# Patient Record
Sex: Female | Born: 1965 | Marital: Married | Smoking: Never smoker
Health system: Southern US, Community
[De-identification: ages and names within clinical notes are randomized; demographics above are authoritative.]

---

## 2000-06-23 HISTORY — PX: AUGMENTATION MAMMAPLASTY: SUR837

## 2013-02-01 ENCOUNTER — Ambulatory Visit (INDEPENDENT_AMBULATORY_CARE_PROVIDER_SITE_OTHER): Payer: BC Managed Care – PPO | Admitting: Family Medicine

## 2013-02-01 VITALS — BP 118/68 | HR 66 | Temp 97.9°F | Resp 18 | Ht 59.0 in | Wt 120.4 lb

## 2013-02-01 DIAGNOSIS — R079 Chest pain, unspecified: Secondary | ICD-10-CM

## 2013-02-01 DIAGNOSIS — H47231 Glaucomatous optic atrophy, right eye: Secondary | ICD-10-CM

## 2013-02-01 DIAGNOSIS — Z0000 Encounter for general adult medical examination without abnormal findings: Secondary | ICD-10-CM

## 2013-02-01 LAB — POCT CBC
Granulocyte percent: 50.6 %G (ref 37–80)
HCT, POC: 44 % (ref 37.7–47.9)
Hemoglobin: 14.1 g/dL (ref 12.2–16.2)
Lymph, poc: 2.1 (ref 0.6–3.4)
MCH, POC: 29.7 pg (ref 27–31.2)
MCHC: 32 g/dL (ref 31.8–35.4)
MCV: 92.9 fL (ref 80–97)
MID (cbc): 0.4 (ref 0–0.9)
MPV: 9.3 fL (ref 0–99.8)
POC Granulocyte: 2.6 (ref 2–6.9)
POC LYMPH PERCENT: 40.7 %L (ref 10–50)
POC MID %: 8.7 %M (ref 0–12)
Platelet Count, POC: 276 10*3/uL (ref 142–424)
RBC: 4.74 M/uL (ref 4.04–5.48)
RDW, POC: 13.7 %
WBC: 5.1 10*3/uL (ref 4.6–10.2)

## 2013-02-01 LAB — GLUCOSE, POCT (MANUAL RESULT ENTRY): POC Glucose: 70 mg/dl (ref 70–99)

## 2013-02-01 NOTE — Progress Notes (Addendum)
Patient IDSophiarose Rollins MRN: [id number], DOB: 1966/06/16, 47 y.o. Date of Encounter: 02/01/2013, 7:40 PM  Primary Physician: No PCP Per Patient  Chief Complaint: Physical (CPE)  HPI: 47 y.o. y/o female with history of noted below here for CPE.  Doing well. No issues/complaints.  LMP: July 20 Pap: 4 years ago Review of Systems: Consitutional: No fever, chills, fatigue, night sweats, lymphadenopathy, or weight changes. Eyes: No visual changes, eye redness, or discharge. ENT/Mouth: Ears: No otalgia, tinnitus, hearing loss, discharge. Nose: No congestion, rhinorrhea, sinus pain, or epistaxis. Throat: No sore throat, post nasal drip, or teeth pain. Cardiovascular: No CP, palpitations, diaphoresis, DOE, edema, orthopnea, PND. Respiratory: No cough, hemoptysis, SOB, or wheezing. Gastrointestinal: No anorexia, dysphagia, reflux, pain, nausea, vomiting, hematemesis, diarrhea, constipation, BRBPR, or melena. Breast: No discharge, pain, swelling, or mass. Genitourinary: No dysuria, frequency, urgency, hematuria, incontinence, nocturia, amenorrhea, vaginal discharge, pruritis, burning, abnormal bleeding, or pain. Musculoskeletal: No decreased ROM, myalgias, stiffness, joint swelling, or weakness. Skin: No rash, erythema, lesion changes, pain, warmth, jaundice, or pruritis. Neurological: No headache, dizziness, syncope, seizures, tremors, memory loss, coordination problems, or paresthesias. Psychological: No anxiety, depression, hallucinations, SI/HI. Endocrine: No fatigue, polydipsia, polyphagia, polyuria, or known diabetes. All other systems were reviewed and are otherwise negative.  History reviewed. No pertinent past medical history.   History reviewed. No pertinent past surgical history.  Home Meds:  Prior to Admission medications   Not on File    Allergies: No Known Allergies  History   Social History  . Marital Status: Married    Spouse Name: N/A    Number of Children: N/A   . Years of Education: N/A   Occupational History  . Not on file.   Social History Main Topics  . Smoking status: Never Smoker   . Smokeless tobacco: Not on file  . Alcohol Use: Not on file  . Drug Use: Not on file  . Sexually Active: Not on file   Other Topics Concern  . Not on file   Social History Narrative  . No narrative on file    No family history on file.  Physical Exam: Blood pressure 118/68, pulse 66, temperature 97.9 F (36.6 C), temperature source Oral, resp. rate 18, height 4\' 11"  (1.499 m), weight 120 lb 6.4 oz (54.613 kg), last menstrual period 01/09/2013, SpO2 100.00%., Body mass index is 24.3 kg/(m^2). General: Well developed, well nourished, in no acute distress. HEENT: Normocephalic, atraumatic. Conjunctiva pink, sclera non-icteric. Pupils 2 mm constricting to 1 mm, round, regular, and equally reactive to light and accomodation. EOMI. Internal auditory canal clear. TMs with good cone of light and without pathology. Nasal mucosa pink. Nares are without discharge. No sinus tenderness. Oral mucosa pink. Dentition normal. Pharynx without exudate. Increased cupping right optic disc Neck: Supple. Trachea midline. No thyromegaly. Full ROM. No lymphadenopathy. Lungs: Clear to auscultation bilaterally without wheezes, rales, or rhonchi. Breathing is of normal effort and unlabored. Cardiovascular: RRR with S1 S2. No murmurs, rubs, or gallops appreciated. Distal pulses 2+ symmetrically. No carotid or abdominal bruits. Breast: Symmetrical. No masses. Nipples without discharge. Abdomen: Soft, non-tender, non-distended with normoactive bowel sounds. No hepatosplenomegaly or masses. No rebound/guarding. No CVA tenderness. Without hernias.  Genitourinary:  External genitalia without lesions. Vaginal mucosa pink. Cervix pink and without discharge. No cervical or adnexal tenderness. Pap smear taken Musculoskeletal: Full range of motion and 5/5 strength throughout. Without swelling,  atrophy, tenderness, crepitus, or warmth. Extremities without clubbing, cyanosis, or edema. Calves supple. Skin: Warm and moist  without erythema, ecchymosis, wounds, or rash. Neuro: A+Ox3. CN II-XII grossly intact. Moves all extremities spontaneously. Full sensation throughout. Normal gait. DTR 2+ throughout upper and lower extremities. Finger to nose intact. Psych:  Responds to questions appropriately with a normal affect.   Studies:   Results for orders placed in visit on 02/01/13  GLUCOSE, POCT (MANUAL RESULT ENTRY)      Result Value Range   POC Glucose 70  70 - 99 mg/dl  POCT CBC      Result Value Range   WBC 5.1  4.6 - 10.2 K/uL   Lymph, poc 2.1  0.6 - 3.4   POC LYMPH PERCENT 40.7  10 - 50 %L   MID (cbc) 0.4  0 - 0.9   POC MID % 8.7  0 - 12 %M   POC Granulocyte 2.6  2 - 6.9   Granulocyte percent 50.6  37 - 80 %G   RBC 4.74  4.04 - 5.48 M/uL   Hemoglobin 14.1  12.2 - 16.2 g/dL   HCT, POC 16.1  09.6 - 47.9 %   MCV 92.9  80 - 97 fL   MCH, POC 29.7  27 - 31.2 pg   MCHC 32.0  31.8 - 35.4 g/dL   RDW, POC 04.5     Platelet Count, POC 276  142 - 424 K/uL   MPV 9.3  0 - 99.8 fL     Assessment/Plan:  47 y.o. y/o female here for CPE -Chest pain - Plan: EKG 12-Lead  Routine general medical examination at a health care facility - Plan: POCT glucose (manual entry), POCT CBC, Comprehensive metabolic panel  Ophthalmology referral  Signed, Elvina Sidle, MD 02/01/2013 7:40 PM

## 2013-02-01 NOTE — Patient Instructions (Signed)

## 2013-02-01 NOTE — Addendum Note (Signed)
Addended by: Elvina Sidle on: 02/01/2013 08:42 PM   Modules accepted: Orders

## 2013-02-02 LAB — COMPREHENSIVE METABOLIC PANEL
ALT: 8 U/L (ref 0–35)
AST: 16 U/L (ref 0–37)
Albumin: 4.8 g/dL (ref 3.5–5.2)
Alkaline Phosphatase: 38 U/L — ABNORMAL LOW (ref 39–117)
BUN: 12 mg/dL (ref 6–23)
CO2: 27 mEq/L (ref 19–32)
Calcium: 9.8 mg/dL (ref 8.4–10.5)
Chloride: 100 mEq/L (ref 96–112)
Creat: 0.77 mg/dL (ref 0.50–1.10)
Glucose, Bld: 91 mg/dL (ref 70–99)
Potassium: 4.1 mEq/L (ref 3.5–5.3)
Sodium: 136 mEq/L (ref 135–145)
Total Bilirubin: 0.4 mg/dL (ref 0.3–1.2)
Total Protein: 8.1 g/dL (ref 6.0–8.3)

## 2013-02-03 LAB — PAP IG (IMAGE GUIDED)

## 2014-05-02 ENCOUNTER — Ambulatory Visit (INDEPENDENT_AMBULATORY_CARE_PROVIDER_SITE_OTHER): Payer: No Typology Code available for payment source | Admitting: Physician Assistant

## 2014-05-02 VITALS — BP 123/70 | HR 68 | Temp 98.1°F | Resp 16 | Ht 59.0 in | Wt 125.2 lb

## 2014-05-02 DIAGNOSIS — Z23 Encounter for immunization: Secondary | ICD-10-CM

## 2014-05-02 NOTE — Patient Instructions (Addendum)
Imodium - to help with diarrhea Peptobismal - to help with stomach upset.Tdap Vaccine (Tetanus, Diphtheria, Pertussis): What You Need to Know 1. Why get vaccinated? Tetanus, diphtheria and pertussis can be very serious diseases, even for adolescents and adults. Tdap vaccine can protect us from these diseases. TETANUS (Lockjaw) causes painful muscle tightening and stiffness, usually all over the body.  It can lead to tightening of muscles in the head and neck so you can't open your mouth, swallow, or sometimes even breathe. Tetanus kills about 1 out of 5 people who are infected. DIPHTHERIA can cause a thick coating to form in the back of the throat.  It can lead to breathing problems, paralysis, heart failure, and death. PERTUSSIS (Whooping Cough) causes severe coughing spells, which can cause difficulty breathing, vomiting and disturbed sleep.  It can also lead to weight loss, incontinence, and rib fractures. Up to 2 in 100 adolescents and 5 in 100 adults with pertussis are hospitalized or have complications, which could include pneumonia or death. These diseases are caused by bacteria. Diphtheria and pertussis are spread from person to person through coughing or sneezing. Tetanus enters the body through cuts, scratches, or wounds. Before vaccines, the [place] saw as many as 200,000 cases a year of diphtheria and pertussis, and hundreds of cases of tetanus. Since vaccination began, tetanus and diphtheria have dropped by about 99% and pertussis by about 80%. 2. Tdap vaccine Tdap vaccine can protect adolescents and adults from tetanus, diphtheria, and pertussis. One dose of Tdap is routinely given at age 46 or 6312. People who did not get Tdap at that age should get it as soon as possible. Tdap is especially important for health care professionals and anyone having close contact with a baby younger than 12 months. Pregnant women should get a dose of Tdap during every pregnancy, to protect the  newborn from pertussis. Infants are most at risk for severe, life-threatening complications from pertussis. A similar vaccine, called Td, protects from tetanus and diphtheria, but not pertussis. A Td booster should be given every 10 years. Tdap may be given as one of these boosters if you have not already gotten a dose. Tdap may also be given after a severe cut or burn to prevent tetanus infection. Your doctor can give you more information. Tdap may safely be given at the same time as other vaccines. 3. Some people should not get this vaccine  If you ever had a life-threatening allergic reaction after a dose of any tetanus, diphtheria, or pertussis containing vaccine, OR if you have a severe allergy to any part of this vaccine, you should not get Tdap. Tell your doctor if you have any severe allergies.  If you had a coma, or long or multiple seizures within 7 days after a childhood dose of DTP or DTaP, you should not get Tdap, unless a cause other than the vaccine was found. You can still get Td.  Talk to your doctor if you:  have epilepsy or another nervous system problem,  had severe pain or swelling after any vaccine containing diphtheria, tetanus or pertussis,  ever had Guillain-Barr Syndrome (GBS),  aren't feeling well on the day the shot is scheduled. 4. Risks of a vaccine reaction With any medicine, including vaccines, there is a chance of side effects. These are usually mild and go away on their own, but serious reactions are also possible. Brief fainting spells can follow a vaccination, leading to injuries from falling. Sitting or lying down for about  15 minutes can help prevent these. Tell your doctor if you feel dizzy or light-headed, or have vision changes or ringing in the ears. Mild problems following Tdap (Did not interfere with activities)  Pain where the shot was given (about 3 in 4 adolescents or 2 in 3 adults)  Redness or swelling where the shot was given (about 1  person in 5)  Mild fever of at least 100.69F (up to about 1 in 25 adolescents or 1 in 100 adults)  Headache (about 3 or 4 people in 10)  Tiredness (about 1 person in 3 or 4)  Nausea, vomiting, diarrhea, stomach ache (up to 1 in 4 adolescents or 1 in 10 adults)  Chills, body aches, sore joints, rash, swollen glands (uncommon) Moderate problems following Tdap (Interfered with activities, but did not require medical attention)  Pain where the shot was given (about 1 in 5 adolescents or 1 in 100 adults)  Redness or swelling where the shot was given (up to about 1 in 16 adolescents or 1 in 25 adults)  Fever over 102F (about 1 in 100 adolescents or 1 in 250 adults)  Headache (about 3 in 20 adolescents or 1 in 10 adults)  Nausea, vomiting, diarrhea, stomach ache (up to 1 or 3 people in 100)  Swelling of the entire arm where the shot was given (up to about 3 in 100). Severe problems following Tdap (Unable to perform usual activities; required medical attention)  Swelling, severe pain, bleeding and redness in the arm where the shot was given (rare). A severe allergic reaction could occur after any vaccine (estimated less than 1 in a million doses). 5. What if there is a serious reaction? What should I look for?  Look for anything that concerns you, such as signs of a severe allergic reaction, very high fever, or behavior changes. Signs of a severe allergic reaction can include hives, swelling of the face and throat, difficulty breathing, a fast heartbeat, dizziness, and weakness. These would start a few minutes to a few hours after the vaccination. What should I do?  If you think it is a severe allergic reaction or other emergency that can't wait, call 9-1-1 or get the person to the nearest hospital. Otherwise, call your doctor.  Afterward, the reaction should be reported to the "Vaccine Adverse Event Reporting System" (VAERS). Your doctor might file this report, or you can do it  yourself through the VAERS web site at www.vaers.LAgents.no, or by calling 1-270-565-8487. VAERS is only for reporting reactions. They do not give medical advice.  6. The National Vaccine Injury Compensation Program The [employer] Vaccine Injury Compensation Program (VICP) is a federal program that was created to compensate people who may have been injured by certain vaccines. Persons who believe they may have been injured by a vaccine can learn about the program and about filing a claim by calling 1-978 155 0322 or visiting the VICP website at SpiritualWord.at. 7. How can I learn more?  Ask your doctor.  Call your local or state health department.  Contact the Centers for Disease Control and Prevention (CDC):  Call 336-374-2343 or visit CDC's website at PicCapture.uy. CDC Tdap Vaccine VIS (10/30/11) Document Released: 12/09/2011 Document Revised: 10/24/2013 Document Reviewed: 09/21/2013 ExitCare Patient Information 2015 [place], [place]. This information is not intended to replace advice given to you by your health care provider. Make sure you discuss any questions you have with your health care provider. Influenza Vaccine (Flu Vaccine, Inactivated or Recombinant) 2014-2015: What You Need to Know  1. Why get vaccinated? Influenza ("flu") is a contagious disease that spreads around the [place] every winter, usually between October and May. Flu is caused by influenza viruses, and is spread mainly by coughing, sneezing, and close contact. Anyone can get flu, but the risk of getting flu is highest among children. Symptoms come on suddenly and may last several days. They can include:  fever/chills  sore throat  muscle aches  fatigue  cough  headache  runny or stuffy nose Flu can make some people much sicker than others. These people include young children, people 3465 and older, pregnant women, and people with certain health conditions-such as heart, lung or  kidney disease, nervous system disorders, or a weakened immune system. Flu vaccination is especially important for these people, and anyone in close contact with them. Flu can also lead to pneumonia, and make existing medical conditions worse. It can cause diarrhea and seizures in children. Each year thousands of people in the [place] die from flu, and many more are hospitalized. Flu vaccine is the best protection against flu and its complications. Flu vaccine also helps prevent spreading flu from person to person. 2. Inactivated and recombinant flu vaccines You are getting an injectable flu vaccine, which is either an "inactivated" or "recombinant" vaccine. These vaccines do not contain any live influenza virus. They are given by injection with a needle, and often called the "flu shot."  A different live, attenuated (weakened) influenza vaccine is sprayed into the nostrils. This vaccine is described in a separate Vaccine Information Statement. Flu vaccination is recommended every year. Some children 6 months through 46 years of age might need two doses during one year. Flu viruses are always changing. Each year's flu vaccine is made to protect against 3 or 4 viruses that are likely to cause disease that year. Flu vaccine cannot prevent all cases of flu, but it is the best defense against the disease.  It takes about 2 weeks for protection to develop after the vaccination, and protection lasts several months to a year. Some illnesses that are not caused by influenza virus are often mistaken for flu. Flu vaccine will not prevent these illnesses. It can only prevent influenza. Some inactivated flu vaccine contains a very small amount of a mercury-based preservative called thimerosal. Studies have shown that thimerosal in vaccines is not harmful, but flu vaccines that do not contain a preservative are available. 3. Some people should not get this vaccine Tell the person who gives you the  vaccine:  If you have any severe, life-threatening allergies. If you ever had a life-threatening allergic reaction after a dose of flu vaccine, or have a severe allergy to any part of this vaccine, including (for example) an allergy to gelatin, antibiotics, or eggs, you may be advised not to get vaccinated. Most, but not all, types of flu vaccine contain a small amount of egg protein.  If you ever had Guillain-Barr Syndrome (a severe paralyzing illness, also called GBS). Some people with a history of GBS should not get this vaccine. This should be discussed with your doctor.  If you are not feeling well. It is usually okay to get flu vaccine when you have a mild illness, but you might be advised to wait until you feel better. You should come back when you are better. 4. Risks of a vaccine reaction With a vaccine, like any medicine, there is a chance of side effects. These are usually mild and go away on their own. Problems  that could happen after any vaccine:  Brief fainting spells can happen after any medical procedure, including vaccination. Sitting or lying down for about 15 minutes can help prevent fainting, and injuries caused by a fall. Tell your doctor if you feel dizzy, or have vision changes or ringing in the ears.  Severe shoulder pain and reduced range of motion in the arm where a shot was given can happen, very rarely, after a vaccination.  Severe allergic reactions from a vaccine are very rare, estimated at less than 1 in a million doses. If one were to occur, it would usually be within a few minutes to a few hours after the vaccination. Mild problems following inactivated flu vaccine:  soreness, redness, or swelling where the shot was given  hoarseness  sore, red or itchy eyes  cough  fever  aches  headache  itching  fatigue If these problems occur, they usually begin soon after the shot and last 1 or 2 days. Moderate problems following inactivated flu  vaccine:  Young children who get inactivated flu vaccine and pneumococcal vaccine (PCV13) at the same time may be at increased risk for seizures caused by fever. Ask your doctor for more information. Tell your doctor if a child who is getting flu vaccine has ever had a seizure. Inactivated flu vaccine does not contain live flu virus, so you cannot get the flu from this vaccine. As with any medicine, there is a very remote chance of a vaccine causing a serious injury or death. The safety of vaccines is always being monitored. For more information, visit: http://floyd.org/www.cdc.gov/vaccinesafety/ 5. What if there is a serious reaction? What should I look for?  Look for anything that concerns you, such as signs of a severe allergic reaction, very high fever, or behavior changes. Signs of a severe allergic reaction can include hives, swelling of the face and throat, difficulty breathing, a fast heartbeat, dizziness, and weakness. These would start a few minutes to a few hours after the vaccination. What should I do?  If you think it is a severe allergic reaction or other emergency that can't wait, call 9-1-1 and get the person to the nearest hospital. Otherwise, call your doctor.  Afterward, the reaction should be reported to the Vaccine Adverse Event Reporting System (VAERS). Your doctor should file this report, or you can do it yourself through the VAERS web site at www.vaers.LAgents.nohhs.gov, or by calling 1-989-315-9212. VAERS does not give medical advice. 6. The National Vaccine Injury Compensation Program The [employer] Vaccine Injury Compensation Program (VICP) is a federal program that was created to compensate people who may have been injured by certain vaccines. Persons who believe they may have been injured by a vaccine can learn about the program and about filing a claim by calling 1-(803)473-3855 or visiting the VICP website at SpiritualWord.atwww.hrsa.gov/vaccinecompensation. There is a time limit to file a claim for  compensation. 7. How can I learn more?  Ask your health care provider.  Call your local or state health department.  Contact the Centers for Disease Control and Prevention (CDC):  Call 314-797-97731-782-142-0648 (1-800-CDC-INFO) or  Visit CDC's website at BiotechRoom.com.cywww.cdc.gov/flu CDC Vaccine Information Statement (Interim) Inactivated Influenza Vaccine (02/08/2013) Document Released: 04/03/2006 Document Revised: 10/24/2013 Document Reviewed: 05/27/2013 [facility] Patient Information 2015 [place], [place]. This information is not intended to replace advice given to you by your health care provider. Make sure you discuss any questions you have with your health care provider.

## 2014-05-02 NOTE — Progress Notes (Signed)
   Subjective:    Patient ID: Abigail Rollins, female    DOB: 1966/01/25, 48 y.o.   MRN: [id number]  HPI  Pt presents for a Tdap vaccination prior to her trip to [place] for a month.  She is otherwise healthy and also would like a flu vaccine.  Review of Systems     Objective:   Physical Exam  Constitutional: She is oriented to person, place, and time. She appears well-developed and well-nourished.  BP 123/70 mmHg  Pulse 68  Temp(Src) 98.1 F (36.7 C) (Oral)  Resp 16  Ht 4\' 11"  (1.499 m)  Wt 125 lb 3.2 oz (56.79 kg)  BMI 25.27 kg/m2  SpO2 97%  LMP 04/30/2014   HENT:  Head: Normocephalic and atraumatic.  Right Ear: External ear normal.  Left Ear: External ear normal.  Eyes: Conjunctivae are normal.  Pulmonary/Chest: Effort normal.  Neurological: She is alert and oriented to person, place, and time.  Skin: Skin is warm and dry.  Psychiatric: She has a normal mood and affect. Her behavior is normal. Judgment and thought content normal.  Vitals reviewed.       Assessment & Plan:  Flu vaccine need - Plan: Flu Vaccine QUAD 36+ mos IM  Need for diphtheria-tetanus-pertussis (Tdap) vaccine, adult/adolescent - Plan: Tdap vaccine greater than or equal to 7yo IM  Benny LennertSarah Weber PA-C  [facility] 05/02/2014 8:00 PM

## 2014-07-17 ENCOUNTER — Ambulatory Visit (INDEPENDENT_AMBULATORY_CARE_PROVIDER_SITE_OTHER): Payer: 59 | Admitting: Family Medicine

## 2014-07-17 VITALS — BP 126/78 | HR 90 | Temp 98.5°F | Resp 24 | Ht 59.0 in | Wt 130.5 lb

## 2014-07-17 DIAGNOSIS — R05 Cough: Secondary | ICD-10-CM

## 2014-07-17 DIAGNOSIS — G4483 Primary cough headache: Secondary | ICD-10-CM

## 2014-07-17 DIAGNOSIS — R059 Cough, unspecified: Secondary | ICD-10-CM

## 2014-07-17 DIAGNOSIS — J069 Acute upper respiratory infection, unspecified: Secondary | ICD-10-CM

## 2014-07-17 MED ORDER — HYDROCODONE-HOMATROPINE 5-1.5 MG/5ML PO SYRP: 5.0000 mL | ORAL_SOLUTION | ORAL | Status: DC | PRN

## 2014-07-17 MED ORDER — BENZONATATE 100 MG PO CAPS: 100.0000 mg | ORAL_CAPSULE | Freq: Three times a day (TID) | ORAL | Status: DC | PRN

## 2014-07-17 MED ORDER — AMOXICILLIN-POT CLAVULANATE 875-125 MG PO TABS: 1.0000 | ORAL_TABLET | Freq: Two times a day (BID) | ORAL | Status: DC

## 2014-07-17 NOTE — Patient Instructions (Addendum)
Drink plenty of fluids (including water and juices) and get enough rest  Take hydrocodone cough syrup 1 teaspoon every 4-6 hours as needed for cough. This will also help your headache. However this will make you a little sleepy, so probably best to not take it when you're going to work  Take the benzonatate 1 or 2 pills 3 times daily as needed for cough. This does not make you sleepy so you can take this during the daytime when you're trying to work or be active.  Take the amoxicillin/clavulanate antibiotic pills one twice daily for infection  Take Tylenol or ibuprofen if needed for headache or other pain or fever  Return if not improving

## 2014-07-17 NOTE — Progress Notes (Signed)
Subjective: Patient has been sick for about 3 weeks with a respiratory tract infection. Over a week ago she developed bad coughing. She has had some facial discomfort. She does not smoke. She has not been running a fever. She is blowing and coughing up a lot of purulent stuff. She is generally pretty healthy.  Objective: TMs normal. Throat clear. Mild maxillary tenderness. Neck supple without nodes. Chest clear. Heart regular without murmurs.   Assessment: Prolonged upper respiratory tract infection with worsening cough  Plan: Augmentin, cough syrup, cough medication Return as needed

## 2014-07-19 ENCOUNTER — Telehealth: Payer: Self-pay

## 2014-07-19 NOTE — Telephone Encounter (Signed)
Pt's daughter called to request a refill for pt's cough medicine. States the mother would like a refill for the cough syrup. CB # [phone]

## 2014-07-20 NOTE — Telephone Encounter (Signed)
Patient called back to check on medication refill

## 2015-09-14 ENCOUNTER — Ambulatory Visit (INDEPENDENT_AMBULATORY_CARE_PROVIDER_SITE_OTHER): Payer: BLUE CROSS/BLUE SHIELD | Admitting: Internal Medicine

## 2015-09-14 VITALS — BP 120/70 | HR 71 | Temp 97.7°F | Resp 16 | Ht 60.0 in | Wt 126.6 lb

## 2015-09-14 DIAGNOSIS — Z0000 Encounter for general adult medical examination without abnormal findings: Secondary | ICD-10-CM | POA: Diagnosis not present

## 2015-09-14 NOTE — Progress Notes (Signed)
Subjective:  By signing my name below, I, Moises Blood, attest that this documentation has been prepared under the direction and in the presence of Tami Lin, MD. Electronically Signed: Moises Blood, Mar-Mac. 09/14/2015 , 7:35 PM .  Patient was seen in Room 12 .   Patient ID: Abigail Rollins, female    DOB: [birth year], 50 y.o.   MRN: [id number] Chief Complaint  Patient presents with  . Annual Exam   HPI Abigail Rollins is a 50 y.o. female who presents to [facility] for annual physical.   Pmhx She denies being on any chronic medications. She denies any chronic illnesses. She denies history of asthma.  Has a fully active life without problems  Cancer Screening Defer to gyn  Immunizations Her last tetanus was in 2015.   OBGYN She has a regular GYN. PAP 2014 wnl. She has 2 children. She has regular periods. She had tubal ligation in year 2000.   Personal Her family moved to [place] in 2002. [place]  There are no active problems to display for this patient.  No current outpatient prescriptions on file.  Review of Systems  Constitutional: Negative for fever, chills and fatigue.  HENT: Negative for dental problem, hearing loss, trouble swallowing and voice change.   Eyes: Negative for photophobia and visual disturbance.  Respiratory: Negative for chest tightness and shortness of breath.   Cardiovascular: Negative for chest pain, palpitations and leg swelling.  Gastrointestinal: Negative for nausea, vomiting, abdominal pain, diarrhea and blood in stool.  Endocrine: Negative for polydipsia, polyphagia and polyuria.  Genitourinary: Negative for difficulty urinating and menstrual problem.  Musculoskeletal: Negative for myalgias, arthralgias and gait problem.  Neurological: Negative for dizziness and headaches.  Hematological: Negative for adenopathy. Does not bruise/bleed easily.  Psychiatric/Behavioral: Negative for behavioral problems, sleep disturbance and dysphoric  mood. Hallucinations: .diag.      Objective:   Physical Exam  Constitutional: She is oriented to person, place, and time. She appears well-developed and well-nourished. No distress.  HENT:  Head: Normocephalic.  Right Ear: External ear normal.  Left Ear: External ear normal.  Nose: Nose normal.  Mouth/Throat: Oropharynx is clear and moist.  Eyes: Conjunctivae and EOM are normal. Pupils are equal, round, and reactive to light.  Neck: Normal range of motion. Neck supple. No thyromegaly present.  Cardiovascular: Normal rate, regular rhythm, normal heart sounds and intact distal pulses.   No murmur heard. Pulmonary/Chest: Effort normal and breath sounds normal. She has no wheezes.  Abdominal: Soft. Bowel sounds are normal. She exhibits no distension and no mass. There is no tenderness. There is no rebound.  Musculoskeletal: Normal range of motion. She exhibits no edema or tenderness.  Lymphadenopathy:    She has no cervical adenopathy.  Neurological: She is alert and oriented to person, place, and time. She has normal reflexes. No cranial nerve deficit.  Skin: Skin is warm and dry. No rash noted.  Psychiatric: She has a normal mood and affect. Her behavior is normal. Judgment and thought content normal.  Nursing note and vitals reviewed.   BP 120/70 mmHg  Pulse 71  Temp(Src) 97.7 F (36.5 C) (Oral)  Resp 16  Ht 5' (1.524 m)  Wt 126 lb 9.6 oz (57.425 kg)  BMI 24.72 kg/m2  SpO2 98%    Assessment & Plan:  I have completed the patient encounter in its entirety as documented by the scribe, with editing by me where necessary. Reilley Latorre P. Laney Pastor, M.D.  PE (physical exam), annual - Plan: CBC with  Differential/Platelet, Comprehensive metabolic panel, Lipid panel, TSH  Results for orders placed or performed in visit on 09/14/15  CBC with Differential/Platelet  Result Value Ref Range   WBC 5.3 4.0 - 10.5 K/uL   RBC 4.53 3.87 - 5.11 MIL/uL   Hemoglobin 13.4 12.0 - 15.0 g/dL   HCT  39.9 36.0 - 46.0 %   MCV 88.1 78.0 - 100.0 fL   MCH 29.6 26.0 - 34.0 pg   MCHC 33.6 30.0 - 36.0 g/dL   RDW 13.9 11.5 - 15.5 %   Platelets 292 150 - 400 K/uL   MPV 10.8 8.6 - 12.4 fL   Neutrophils Relative % 57 43 - 77 %   Neutro Abs 3.0 1.7 - 7.7 K/uL   Lymphocytes Relative 33 12 - 46 %   Lymphs Abs 1.7 0.7 - 4.0 K/uL   Monocytes Relative 7 3 - 12 %   Monocytes Absolute 0.4 0.1 - 1.0 K/uL   Eosinophils Relative 3 0 - 5 %   Eosinophils Absolute 0.2 0.0 - 0.7 K/uL   Basophils Relative 0 0 - 1 %   Basophils Absolute 0.0 0.0 - 0.1 K/uL   Smear Review Criteria for review not met   Comprehensive metabolic panel  Result Value Ref Range   Sodium 140 135 - 146 mmol/L   Potassium 3.7 3.5 - 5.3 mmol/L   Chloride 103 98 - 110 mmol/L   CO2 24 20 - 31 mmol/L   Glucose, Bld 114 (H) 65 - 99 mg/dL   BUN 18 7 - 25 mg/dL   Creat 0.66 0.50 - 1.10 mg/dL   Total Bilirubin 0.2 0.2 - 1.2 mg/dL   Alkaline Phosphatase 43 33 - 115 U/L   AST 15 10 - 35 U/L   ALT 10 6 - 29 U/L   Total Protein 7.0 6.1 - 8.1 g/dL   Albumin 4.1 3.6 - 5.1 g/dL   Calcium 9.1 8.6 - 10.2 mg/dL  Lipid panel  Result Value Ref Range   Cholesterol 220 (H) 125 - 200 mg/dL   Triglycerides 318 (H) <150 mg/dL   HDL 46 >=46 mg/dL   Total CHOL/HDL Ratio 4.8 <=5.0 Ratio   VLDL 64 (H) <30 mg/dL   LDL Cholesterol 110 <130 mg/dL  TSH  Result Value Ref Range   TSH 1.39 mIU/L   Reduce carbs, lose 10-15lbs-reck 40mo

## 2015-09-14 NOTE — Patient Instructions (Signed)
     IF you received an x-ray today, you will receive an invoice from [facility]. Please contact [facility] at [phone] with questions or concerns regarding your invoice.   IF you received labwork today, you will receive an invoice from [facility]. Please contact [facility] at [phone] with questions or concerns regarding your invoice.   Our billing staff will not be able to assist you with questions regarding bills from these companies.  You will be contacted with the lab results as soon as they are available. The fastest way to get your results is to activate your My Chart account. Instructions are located on the last page of this paperwork. If you have not heard from us regarding the results in 2 weeks, please contact this office.      

## 2015-09-15 LAB — CBC WITH DIFFERENTIAL/PLATELET
BASOS ABS: 0 10*3/uL (ref 0.0–0.1)
Basophils Relative: 0 % (ref 0–1)
EOS ABS: 0.2 10*3/uL (ref 0.0–0.7)
EOS PCT: 3 % (ref 0–5)
HEMATOCRIT: 39.9 % (ref 36.0–46.0)
Hemoglobin: 13.4 g/dL (ref 12.0–15.0)
LYMPHS PCT: 33 % (ref 12–46)
Lymphs Abs: 1.7 10*3/uL (ref 0.7–4.0)
MCH: 29.6 pg (ref 26.0–34.0)
MCHC: 33.6 g/dL (ref 30.0–36.0)
MCV: 88.1 fL (ref 78.0–100.0)
MONO ABS: 0.4 10*3/uL (ref 0.1–1.0)
MPV: 10.8 fL (ref 8.6–12.4)
Monocytes Relative: 7 % (ref 3–12)
Neutro Abs: 3 10*3/uL (ref 1.7–7.7)
Neutrophils Relative %: 57 % (ref 43–77)
PLATELETS: 292 10*3/uL (ref 150–400)
RBC: 4.53 MIL/uL (ref 3.87–5.11)
RDW: 13.9 % (ref 11.5–15.5)
WBC: 5.3 10*3/uL (ref 4.0–10.5)

## 2015-09-15 LAB — LIPID PANEL
CHOLESTEROL: 220 mg/dL — AB (ref 125–200)
HDL: 46 mg/dL (ref >=46)
LDL Cholesterol: 110 mg/dL (ref <130)
TRIGLYCERIDES: 318 mg/dL — AB (ref <150)
Total CHOL/HDL Ratio: 4.8 Ratio (ref <=5.0)
VLDL: 64 mg/dL — ABNORMAL HIGH (ref <30)

## 2015-09-15 LAB — COMPREHENSIVE METABOLIC PANEL
ALK PHOS: 43 U/L (ref 33–115)
ALT: 10 U/L (ref 6–29)
AST: 15 U/L (ref 10–35)
Albumin: 4.1 g/dL (ref 3.6–5.1)
BILIRUBIN TOTAL: 0.2 mg/dL (ref 0.2–1.2)
BUN: 18 mg/dL (ref 7–25)
CALCIUM: 9.1 mg/dL (ref 8.6–10.2)
CO2: 24 mmol/L (ref 20–31)
Chloride: 103 mmol/L (ref 98–110)
Creat: 0.66 mg/dL (ref 0.50–1.10)
GLUCOSE: 114 mg/dL — AB (ref 65–99)
POTASSIUM: 3.7 mmol/L (ref 3.5–5.3)
Sodium: 140 mmol/L (ref 135–146)
Total Protein: 7 g/dL (ref 6.1–8.1)

## 2015-09-15 LAB — TSH: TSH: 1.39 mIU/L

## 2015-09-16 ENCOUNTER — Encounter: Payer: Self-pay | Admitting: Internal Medicine

## 2016-01-11 ENCOUNTER — Ambulatory Visit (INDEPENDENT_AMBULATORY_CARE_PROVIDER_SITE_OTHER): Payer: BLUE CROSS/BLUE SHIELD | Admitting: Family Medicine

## 2016-01-11 VITALS — BP 108/70 | HR 105 | Temp 98.9°F | Resp 16 | Ht 59.0 in | Wt 124.0 lb

## 2016-01-11 DIAGNOSIS — R350 Frequency of micturition: Secondary | ICD-10-CM

## 2016-01-11 LAB — POCT URINALYSIS DIP (MANUAL ENTRY)
BILIRUBIN UA: NEGATIVE
Bilirubin, UA: NEGATIVE
Glucose, UA: NEGATIVE
Nitrite, UA: POSITIVE — AB
Spec Grav, UA: 1.015
UROBILINOGEN UA: 0.2
pH, UA: 6

## 2016-01-11 LAB — POC MICROSCOPIC URINALYSIS (UMFC): MUCUS RE: ABSENT

## 2016-01-11 MED ORDER — CEPHALEXIN 500 MG PO CAPS: 500.0000 mg | ORAL_CAPSULE | Freq: Two times a day (BID) | ORAL | Status: DC

## 2016-01-11 NOTE — Progress Notes (Signed)
    Abigail Rollins is a 50 y.o. female who presents to [facility] today for Urinary tract infection. Patient presents to clinic today to discuss urinary frequency urgency and dysuria. Symptoms started 5 days ago. She notes mild back pain and some mild body aches. She denies any fevers chills nausea vomiting or diarrhea. She's tried some AZO and Aleve that it helped only a little. She denies any vaginal discharge or bleeding.    History reviewed. No pertinent past medical history. History reviewed. No pertinent past surgical history. Social History  Substance Use Topics  . Smoking status: Never Smoker   . Smokeless tobacco: Never Used  . Alcohol Use: No   ROS as above Medications: Current Outpatient Prescriptions  Medication Sig Dispense Refill  . Phenazopyridine HCl 97.5 MG TABS Take by mouth.     No current facility-administered medications for this visit.   No Known Allergies   Exam:  BP 108/70 mmHg  Pulse 105  Temp(Src) 98.9 F (37.2 C)  Resp 16  Ht 4\' 11"  (1.499 m)  Wt 124 lb (56.246 kg)  BMI 25.03 kg/m2  SpO2 99%  LMP 12/21/2015 (Approximate) Gen: Well NAD HEENT: EOMI,  MMM Lungs: Normal work of breathing. CTABL Heart: RRR no MRG Abd: NABS, Soft. Nondistended, Nontender mild CVA angle tenderness to percussion right side. Exts: Brisk capillary refill, warm and well perfused.   Results for orders placed or performed in visit on 01/11/16 (from the past 24 hour(s))  POCT urinalysis dipstick     Status: Abnormal   Collection Time: 01/11/16  3:09 PM  Result Value Ref Range   Color, UA yellow yellow   Clarity, UA clear clear   Glucose, UA negative negative   Bilirubin, UA negative negative   Ketones, POC UA negative negative   Spec Grav, UA 1.015    Blood, UA moderate (A) negative   pH, UA 6.0    Protein Ur, POC =30 (A) negative   Urobilinogen, UA 0.2    Nitrite, UA Positive (A) Negative   Leukocytes, UA large (3+) (A) Negative  POCT Microscopic Urinalysis ([facility])      Status: Abnormal   Collection Time: 01/11/16  3:09 PM  Result Value Ref Range   WBC,UR,HPF,POC Moderate (A) None WBC/hpf   RBC,UR,HPF,POC Few (A) None RBC/hpf   Bacteria Few (A) None, Too numerous to count   Mucus Absent Absent   Epithelial Cells, UR Per Microscopy Many (A) None, Too numerous to count cells/hpf   No results found.  Assessment and Plan: 50 y.o. female with urinary tract infection. Culture pending.  Treatment with Keflex. Return as needed.  Discussed warning signs or symptoms. Please see discharge instructions. Patient expresses understanding.

## 2016-01-11 NOTE — Patient Instructions (Addendum)
Thank you for coming in today. Take the antibiotic twice daily for 1 week.   Urinary Tract Infection Urinary tract infections (UTIs) can develop anywhere along your urinary tract. Your urinary tract is your body's drainage system for removing wastes and extra water. Your urinary tract includes two kidneys, two ureters, a bladder, and a urethra. Your kidneys are a pair of bean-shaped organs. Each kidney is about the size of your fist. They are located below your ribs, one on each side of your spine. CAUSES Infections are caused by microbes, which are microscopic organisms, including fungi, viruses, and bacteria. These organisms are so small that they can only be seen through a microscope. Bacteria are the microbes that most commonly cause UTIs. SYMPTOMS  Symptoms of UTIs may vary by age and gender of the patient and by the location of the infection. Symptoms in young women typically include a frequent and intense urge to urinate and a painful, burning feeling in the bladder or urethra during urination. Older women and men are more likely to be tired, shaky, and weak and have muscle aches and abdominal pain. A fever may mean the infection is in your kidneys. Other symptoms of a kidney infection include pain in your back or sides below the ribs, nausea, and vomiting. DIAGNOSIS To diagnose a UTI, your caregiver will ask you about your symptoms. Your caregiver will also ask you to provide a urine sample. The urine sample will be tested for bacteria and white blood cells. White blood cells are made by your body to help fight infection. TREATMENT  Typically, UTIs can be treated with medication. Because most UTIs are caused by a bacterial infection, they usually can be treated with the use of antibiotics. The choice of antibiotic and length of treatment depend on your symptoms and the type of bacteria causing your infection. HOME CARE INSTRUCTIONS  If you were prescribed antibiotics, take them exactly as  your caregiver instructs you. Finish the medication even if you feel better after you have only taken some of the medication.  Drink enough water and fluids to keep your urine clear or pale yellow.  Avoid caffeine, tea, and carbonated beverages. They tend to irritate your bladder.  Empty your bladder often. Avoid holding urine for long periods of time.  Empty your bladder before and after sexual intercourse.  After a bowel movement, women should cleanse from front to back. Use each tissue only once. SEEK MEDICAL CARE IF:   You have back pain.  You develop a fever.  Your symptoms do not begin to resolve within 3 days. SEEK IMMEDIATE MEDICAL CARE IF:   You have severe back pain or lower abdominal pain.  You develop chills.  You have nausea or vomiting.  You have continued burning or discomfort with urination. MAKE SURE YOU:   Understand these instructions.  Will watch your condition.  Will get help right away if you are not doing well or get worse.   This information is not intended to replace advice given to you by your health care provider. Make sure you discuss any questions you have with your health care provider.   Document Released: 03/19/2005 Document Revised: 02/28/2015 Document Reviewed: 07/18/2011 Elsevier Interactive Patient Education 2016 [employer].    IF you received an x-ray today, you will receive an invoice from [facility]. Please contact [facility] at [phone] with questions or concerns regarding your invoice.   IF you received labwork today, you will receive an invoice from [facility]. Please contact [facility] at [phone] with questions or concerns regarding your invoice.   Our billing staff will not be able to assist you with questions regarding bills from these companies.  You will be contacted with the lab results as soon as they are available. The fastest way to get your results is to  activate your My Chart account. Instructions are located on the last page of this paperwork. If you have not heard from us regarding the results in 2 weeks, please contact this office.

## 2016-01-14 LAB — URINE CULTURE

## 2016-08-18 ENCOUNTER — Encounter: Payer: Self-pay | Admitting: Family Medicine

## 2016-08-18 ENCOUNTER — Ambulatory Visit (INDEPENDENT_AMBULATORY_CARE_PROVIDER_SITE_OTHER): Payer: Self-pay | Admitting: Family Medicine

## 2016-08-18 VITALS — BP 113/69 | HR 77 | Temp 98.3°F | Resp 18 | Ht 59.0 in | Wt 127.2 lb

## 2016-08-18 DIAGNOSIS — K644 Residual hemorrhoidal skin tags: Secondary | ICD-10-CM

## 2016-08-18 DIAGNOSIS — Z23 Encounter for immunization: Secondary | ICD-10-CM

## 2016-08-18 LAB — HEMOCCULT GUIAC POC 1CARD (OFFICE): FECAL OCCULT BLD: POSITIVE — AB

## 2016-08-18 MED ORDER — DOCUSATE SODIUM 100 MG PO CAPS: 100.0000 mg | ORAL_CAPSULE | Freq: Two times a day (BID) | ORAL | 11 refills | Status: DC

## 2016-08-18 MED ORDER — HYDROCORTISONE ACETATE 25 MG RE SUPP: 25.0000 mg | Freq: Two times a day (BID) | RECTAL | 0 refills | Status: DC

## 2016-08-18 NOTE — Progress Notes (Signed)
   Subjective:    Patient ID: Abigail Rollins, female    DOB: 1965/10/09, 51 y.o.   MRN: [id number]  HPI  From [place]   Review of Systems     Objective:   Physical Exam        Assessment & Plan:

## 2016-08-18 NOTE — Patient Instructions (Addendum)
     IF you received an x-ray today, you will receive an invoice from [facility]. Please contact [facility] at [phone] with questions or concerns regarding your invoice.   IF you received labwork today, you will receive an invoice from [facility]. Please contact [facility] at [phone] with questions or concerns regarding your invoice.   Our billing staff will not be able to assist you with questions regarding bills from these companies.  You will be contacted with the lab results as soon as they are available. The fastest way to get your results is to activate your My Chart account. Instructions are located on the last page of this paperwork. If you have not heard from us regarding the results in 2 weeks, please contact this office.      Hemorrhoids Introduction Hemorrhoids are swollen veins in and around the rectum or anus. Hemorrhoids can cause pain, itching, or bleeding. Most of the time, they do not cause serious problems. They usually get better with diet changes, lifestyle changes, and other home treatments. Follow these instructions at home: Eating and drinking  Eat foods that have fiber, such as whole grains, beans, nuts, fruits, and vegetables. Ask your doctor about taking products that have added fiber (fibersupplements).  Drink enough fluid to keep your pee (urine) clear or pale yellow. For Pain and Swelling  Take a warm-water bath (sitz bath) for 20 minutes to ease pain. Do this 3-4 times a day.  If directed, put ice on the painful area. It may be helpful to use ice between your warm baths.  Put ice in a plastic bag.  Place a towel between your skin and the bag.  Leave the ice on for 20 minutes, 2-3 times a day. General instructions  Take over-the-counter and prescription medicines only as told by your doctor.  Medicated creams and medicines that are inserted into the anus (suppositories) may be used or applied as told.  Exercise  often.  Go to the bathroom when you have the urge to poop (to have a bowel movement). Do not wait.  Avoid pushing too hard (straining) when you poop.  Keep the butt area dry and clean. Use wet toilet paper or moist paper towels.  Do not sit on the toilet for a long time. Contact a doctor if:  You have any of these:  Pain and swelling that do not get better with treatment or medicine.  Bleeding that will not stop.  Trouble pooping or you cannot poop.  Pain or swelling outside the area of the hemorrhoids. This information is not intended to replace advice given to you by your health care provider. Make sure you discuss any questions you have with your health care provider. Document Released: 03/18/2008 Document Revised: 11/15/2015 Document Reviewed: 02/21/2015  2017 Elsevier

## 2016-08-18 NOTE — Progress Notes (Signed)
Subjective:    Patient ID: Abigail Rollins, female    DOB: [birth year], 51 y.o.   MRN: [id number] Chief Complaint  Patient presents with  . Pain in bottom    Saturday morning. Pt said when she was using the bathroom it hurt.    HPI  Abigail Rollins is a delightful [place] 51 yo who presents with rectal pain That initially occurred during and has persisted since a very hard stool 2d prior.  She denies any chronic problems with constipation but states for the several days prior she had been very busy at work and had forgotten to drink any water. She has not needed to take anything for the constipation which she reports is now resolved. She has been using some anti-itch blue ointment externally. Denies any history of hemorrhoids or fissures. During the initial stool 2 days prior she noted bright red blood on toilet paper but none since. No melena or hematochezia.  History reviewed. No pertinent past medical history. History reviewed. No pertinent surgical history. Current Outpatient Prescriptions on File Prior to Visit  Medication Sig Dispense Refill  . Phenazopyridine HCl 97.5 MG TABS Take by mouth.     No current facility-administered medications on file prior to visit.    No Known Allergies History reviewed. No pertinent family history. Social History   Social History  . Marital status: Married    Spouse name: N/A  . Number of children: N/A  . Years of education: N/A   Social History Main Topics  . Smoking status: Never Smoker  . Smokeless tobacco: Never Used  . Alcohol use No  . Drug use: No  . Sexual activity: Not Asked   Other Topics Concern  . None   Social History Narrative  . None     Review of Systems  Constitutional: Negative for activity change, appetite change, chills, diaphoresis and fever.  Gastrointestinal: Positive for anal bleeding, constipation and rectal pain. Negative for abdominal distention, abdominal pain, blood in stool, diarrhea, nausea and vomiting.    Genitourinary: Negative for difficulty urinating, dysuria, frequency and pelvic pain.  Skin: Negative for color change and rash.  Hematological: Does not bruise/bleed easily.       Objective:   Physical Exam  Constitutional: She is oriented to person, place, and time. She appears well-developed and well-nourished. No distress.  HENT:  Head: Normocephalic and atraumatic.  Right Ear: External ear normal.  Eyes: Conjunctivae are normal. No scleral icterus.  Pulmonary/Chest: Effort normal.  Genitourinary: Rectal exam shows external hemorrhoid, internal hemorrhoid, tenderness and guaiac positive stool. Rectal exam shows no fissure, no mass and anal tone normal.  Genitourinary Comments: Large external hemorrhoid and 12 o'clock (anterior) with area of recent bright red bleeding visible. No fissure. No thrombosis. + tender. No fluctuation or induration. Small internal hemorrhoid palpable proximal but did not prolapse  Neurological: She is alert and oriented to person, place, and time.  Skin: Skin is warm and dry. She is not diaphoretic. No erythema.  Psychiatric: She has a normal mood and affect. Her behavior is normal.   Results for orders placed or performed in visit on 08/18/16  POCT occult blood stool  Result Value Ref Range   Fecal Occult Blood, POC Positive (A) Negative   Card #1 Date 08/18/2016    Card #2 Fecal Occult Blod, POC     Card #2 Date     Card #3 Fecal Occult Blood, POC     Card #3 Date  BP 113/69   Pulse 77   Temp 98.3 F (36.8 C) (Oral)   Resp 18   Ht 4\' 11"  (1.499 m)   Wt 127 lb 3.2 oz (57.7 kg)   LMP 08/04/2016   SpO2 98%   BMI 25.69 kg/m     Assessment & Plan:   1. External hemorrhoid, bleeding   2. Need for prophylactic vaccination and inoculation against influenza   Reviewed importance of keeping stools very soft - start fiber supp or miralax, can add in colace for now.  As long as she is able to do this, her hemorrhoid should cont to resolve. If  supp don't work, can change to ext topical cream.  Orders Placed This Encounter  Procedures  . Flu Vaccine QUAD 36+ mos IM  . POCT occult blood stool    Meds ordered this encounter  Medications  . hydrocortisone (ANUSOL-HC) 25 MG suppository    Sig: Place 1 suppository (25 mg total) rectally 2 (two) times daily.    Dispense:  12 suppository    Refill:  0  . docusate sodium (COLACE) 100 MG capsule    Sig: Take 1 capsule (100 mg total) by mouth 2 (two) times daily.    Dispense:  10 capsule    Refill:  11    Abigail Rollins, M.D.  Primary Care at [facility] [address] [place], [place] [address] [phone] phone [phone] fax  08/19/16 10:10 AM

## 2018-08-17 ENCOUNTER — Other Ambulatory Visit (HOSPITAL_COMMUNITY)
Admission: RE | Admit: 2018-08-17 | Discharge: 2018-08-17 | Disposition: A | Discharge status: Home / Self Care | Payer: Commercial Managed Care - PPO | Source: Ambulatory Visit | Attending: Family Medicine | Admitting: Family Medicine

## 2018-08-17 ENCOUNTER — Ambulatory Visit
Admission: RE | Admit: 2018-08-17 | Discharge: 2018-08-17 | Disposition: A | Discharge status: Home / Self Care | Payer: Commercial Managed Care - PPO | Source: Ambulatory Visit | Attending: Family Medicine | Admitting: Family Medicine

## 2018-08-17 ENCOUNTER — Other Ambulatory Visit: Payer: Self-pay | Admitting: Family Medicine

## 2018-08-17 DIAGNOSIS — Z124 Encounter for screening for malignant neoplasm of cervix: Secondary | ICD-10-CM | POA: Diagnosis not present

## 2018-08-17 DIAGNOSIS — R0989 Other specified symptoms and signs involving the circulatory and respiratory systems: Secondary | ICD-10-CM

## 2018-08-20 LAB — CYTOLOGY - PAP
CHLAMYDIA, DNA PROBE: NEGATIVE
DIAGNOSIS: NEGATIVE
HPV: NOT DETECTED
Neisseria Gonorrhea: NEGATIVE

## 2018-10-11 ENCOUNTER — Other Ambulatory Visit: Payer: Self-pay | Admitting: Family Medicine

## 2018-10-11 DIAGNOSIS — Z1231 Encounter for screening mammogram for malignant neoplasm of breast: Secondary | ICD-10-CM

## 2018-12-14 ENCOUNTER — Ambulatory Visit: Payer: Commercial Managed Care - PPO

## 2019-10-25 ENCOUNTER — Other Ambulatory Visit: Payer: Self-pay | Admitting: Family Medicine

## 2019-10-25 DIAGNOSIS — Z1231 Encounter for screening mammogram for malignant neoplasm of breast: Secondary | ICD-10-CM

## 2020-06-13 ENCOUNTER — Ambulatory Visit (HOSPITAL_COMMUNITY)
Admission: EM | Admit: 2020-06-13 | Discharge: 2020-06-13 | Disposition: A | Discharge status: Home / Self Care | Payer: Managed Care, Other (non HMO) | Attending: Emergency Medicine | Admitting: Emergency Medicine

## 2020-06-13 ENCOUNTER — Ambulatory Visit (INDEPENDENT_AMBULATORY_CARE_PROVIDER_SITE_OTHER): Payer: Managed Care, Other (non HMO)

## 2020-06-13 ENCOUNTER — Other Ambulatory Visit: Payer: Self-pay

## 2020-06-13 ENCOUNTER — Encounter (HOSPITAL_COMMUNITY): Payer: Self-pay

## 2020-06-13 DIAGNOSIS — R0789 Other chest pain: Secondary | ICD-10-CM

## 2020-06-13 DIAGNOSIS — R079 Chest pain, unspecified: Secondary | ICD-10-CM

## 2020-06-13 DIAGNOSIS — R0781 Pleurodynia: Secondary | ICD-10-CM

## 2020-06-13 MED ORDER — NAPROXEN 500 MG PO TABS: 500.0000 mg | ORAL_TABLET | Freq: Two times a day (BID) | ORAL | 0 refills | Status: DC
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
## 2020-06-13 NOTE — Discharge Instructions (Signed)
Your xray does not demonstrate any rib fractures.  You may apply ice to affected area, and take naproxen twice a day, with food, to help with pain.  I do encourage you to regularly take deep breaths and even force a cough to promote lung health.  Return for any worsening of pain or shortness of breath .   xray ????????????????????????????????????????????????. ??????????????????????????????????????????, ?????? naproxen ??????????????, ?????????, ???????????????????????. ??????????????????????????????????????????????????????????????????????????????????????. ?????????????????????????????????????????????????????. xray  khongchao bodai saaednghaihen kan kaduk hak khong kaduk hak  chao adcha sai noa kon sai boliuaen thi thukkathob  laekin naproxen  songtheu tomu  phom ahan  pheuosuany hai mioakan cheb  kha pha chao kho nae noa hai than pen pok ka ti hai chai loek lae aemn aet bang khab ai pheu song soem su kha phab pod  kabkhun soalabkan haiaehng khun khong akan chebpuad ru [employer]

## 2021-08-19 ENCOUNTER — Other Ambulatory Visit (HOSPITAL_BASED_OUTPATIENT_CLINIC_OR_DEPARTMENT_OTHER): Payer: Self-pay | Admitting: Physician Assistant

## 2021-08-19 DIAGNOSIS — Z1231 Encounter for screening mammogram for malignant neoplasm of breast: Secondary | ICD-10-CM

## 2021-08-27 ENCOUNTER — Encounter (HOSPITAL_BASED_OUTPATIENT_CLINIC_OR_DEPARTMENT_OTHER): Payer: Self-pay

## 2021-08-27 ENCOUNTER — Other Ambulatory Visit (HOSPITAL_BASED_OUTPATIENT_CLINIC_OR_DEPARTMENT_OTHER): Payer: Self-pay | Admitting: Physician Assistant

## 2021-08-27 ENCOUNTER — Ambulatory Visit (HOSPITAL_BASED_OUTPATIENT_CLINIC_OR_DEPARTMENT_OTHER)
Admission: RE | Admit: 2021-08-27 | Discharge: 2021-08-27 | Disposition: A | Discharge status: Home / Self Care | Payer: Self-pay | Source: Ambulatory Visit | Attending: Physician Assistant | Admitting: Physician Assistant

## 2021-08-27 ENCOUNTER — Other Ambulatory Visit: Payer: Self-pay

## 2021-08-27 DIAGNOSIS — Z1231 Encounter for screening mammogram for malignant neoplasm of breast: Secondary | ICD-10-CM

## 2021-08-28 ENCOUNTER — Other Ambulatory Visit: Payer: Self-pay | Admitting: Physician Assistant

## 2021-08-28 DIAGNOSIS — R928 Other abnormal and inconclusive findings on diagnostic imaging of breast: Secondary | ICD-10-CM

## 2021-12-05 ENCOUNTER — Ambulatory Visit
Admission: EM | Admit: 2021-12-05 | Discharge: 2021-12-05 | Disposition: A | Discharge status: Home / Self Care | Payer: 59 | Attending: Urgent Care | Admitting: Urgent Care

## 2021-12-05 ENCOUNTER — Encounter: Payer: Self-pay | Admitting: Emergency Medicine

## 2021-12-05 DIAGNOSIS — H8113 Benign paroxysmal vertigo, bilateral: Secondary | ICD-10-CM | POA: Diagnosis not present

## 2021-12-05 MED ORDER — MECLIZINE HCL 12.5 MG PO TABS: 12.5000 mg | ORAL_TABLET | Freq: Three times a day (TID) | ORAL | 0 refills | Status: AC | PRN

## 2021-12-05 NOTE — ED Triage Notes (Signed)
Pt is experiencing room spinning and dizziness x 2 weeks. No other complaints.

## 2021-12-05 NOTE — ED Provider Notes (Signed)
[facility]   MRN: [id number] DOB: [birth year]  Subjective:   Abigail Rollins is a 56 y.o. female presenting for 2-week history of acute onset of transient intermittent episodes of vertigo and dizziness.  Symptoms primarily happen when she moves her head too quickly or bends down or gets up from a lying or sitting position.  Symptoms go away with staying steady for bed.  Denies confusion, headache, vision changes, ear pain, runny or stuffy nose, throat pain, chest pain, shortness of breath, heart racing, palpitations, nausea, vomiting, belly pain, urinary symptoms.  She has been advised in the past to check her blood sugar regularly and she has done this.  She presents to me a document of her blood sugar checks and all have been between 100-130 fasting.  Denies any history of stroke, heart disease, diabetes.  Regarding lifestyle, she rarely drinks up to a bottle of water per day.  She drinks coffee regularly.  Denies smoking, drug use, alcohol use.  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  No current facility-administered medications for this encounter.  Current Outpatient Medications:    docusate sodium (COLACE) 100 MG capsule, Take 1 capsule (100 mg total) by mouth 2 (two) times daily., Disp: 10 capsule, Rfl: 11   hydrocortisone (ANUSOL-HC) 25 MG suppository, Place 1 suppository (25 mg total) rectally 2 (two) times daily., Disp: 12 suppository, Rfl: 0   naproxen (NAPROSYN) 500 MG tablet, Take 1 tablet (500 mg total) by mouth 2 (two) times daily., Disp: 30 tablet, Rfl: 0   Phenazopyridine HCl 97.5 MG TABS, Take by mouth., Disp: , Rfl:    No Known Allergies  History reviewed. No pertinent past medical history.   Past Surgical History:  Procedure Laterality Date   AUGMENTATION MAMMAPLASTY Bilateral 2002    History reviewed. No pertinent family history.  Social History    Tobacco Use   Smoking status: Never   Smokeless tobacco: Never  Substance Use Topics   Alcohol use: No    Alcohol/week: 0.0 standard drinks of alcohol   Drug use: No    ROS   Objective:   Vitals: BP 116/78   Pulse 78   Temp 97.8 F (36.6 C)   Resp 18   LMP 08/04/2016   SpO2 98%   Physical Exam Constitutional:      General: She is not in acute distress.    Appearance: Normal appearance. She is well-developed and normal weight. She is not ill-appearing, toxic-appearing or diaphoretic.  HENT:     Head: Normocephalic and atraumatic.     Right Ear: Tympanic membrane, ear canal and external ear normal. No drainage or tenderness. No middle ear effusion. There is no impacted cerumen. Tympanic membrane is not erythematous.     Left Ear: Tympanic membrane, ear canal and external ear normal. No drainage or tenderness.  No middle ear effusion. There is no impacted cerumen. Tympanic membrane is not erythematous.     Nose: Nose normal. No congestion or rhinorrhea.     Mouth/Throat:     Mouth: Mucous membranes are moist. No oral lesions.     Pharynx: No pharyngeal swelling, oropharyngeal exudate, posterior oropharyngeal erythema or uvula swelling.     Tonsils: No tonsillar exudate or tonsillar abscesses.  Eyes:     General: No scleral icterus.       Right eye: No discharge.  Left eye: No discharge.     Extraocular Movements: Extraocular movements intact.     Right eye: Normal extraocular motion.     Left eye: Normal extraocular motion.     Conjunctiva/sclera: Conjunctivae normal.  Neck:     Meningeal: Brudzinski's sign and Kernig's sign absent.  Cardiovascular:     Rate and Rhythm: Normal rate and regular rhythm.     Heart sounds: Normal heart sounds. No murmur heard.    No friction rub. No gallop.  Pulmonary:     Effort: Pulmonary effort is normal. No respiratory distress.     Breath sounds: No stridor. No wheezing, rhonchi or rales.  Chest:     Chest wall: No  tenderness.  Musculoskeletal:     Cervical back: Normal range of motion and neck supple.  Lymphadenopathy:     Cervical: No cervical adenopathy.  Skin:    General: Skin is warm and dry.  Neurological:     General: No focal deficit present.     Mental Status: She is alert and oriented to person, place, and time.     Cranial Nerves: No cranial nerve deficit, dysarthria or facial asymmetry.     Motor: No weakness or pronator drift.     Coordination: Romberg sign negative. Coordination normal. Finger-Nose-Finger Test and Heel to [facility] Test normal. Rapid alternating movements normal.     Gait: Gait and tandem walk normal.     Deep Tendon Reflexes: Reflexes normal.     Comments: Negative Romberg and pronator drift.  Psychiatric:        Mood and Affect: Mood normal.        Behavior: Behavior normal.        Thought Content: Thought content normal.        Judgment: Judgment normal.     Assessment and Plan :   PDMP not reviewed this encounter.  1. Benign paroxysmal positional vertigo due to bilateral vestibular disorder    Patient has a clear cardiopulmonary exam, completely normal neurologic exam.  Offered labs but patient declined for now.  I have low suspicion for an acute encephalopathy.  Symptoms and physical exam more consistent with BPPV, dehydration and lack of self-care given her work style.  Recommended symptomatic relief, counseled on BPPV and its general management including instructions for the Epley maneuver.  Emphasized need for better self-care including better hydration, working less.  Discussed signs of an acute encephalopathy, recommended recheck and/or ER visit if this develops. Counseled patient on potential for adverse effects with medications prescribed today, patient verbalized understanding.    Wallis Bamberg, PA-C 12/05/21 1458

## 2022-09-07 IMAGING — MG DIGITAL SCREENING BREAST BILAT IMPLANT W/ TOMO W/ CAD
8 of 12 series · 8 of 28 positions shown · non-contrast
Comparison: None.

CLINICAL DATA: Screening.

EXAM:
DIGITAL SCREENING BILATERAL MAMMOGRAM WITH IMPLANTS, CAD AND
TOMOSYNTHESIS
TECHNIQUE: Bilateral screening digital craniocaudal and mediolateral oblique
mammograms were obtained. Bilateral screening digital breast
tomosynthesis was performed. The images were evaluated with
computer-aided detection. Standard and/or implant displaced views
were performed.

[L MLO]
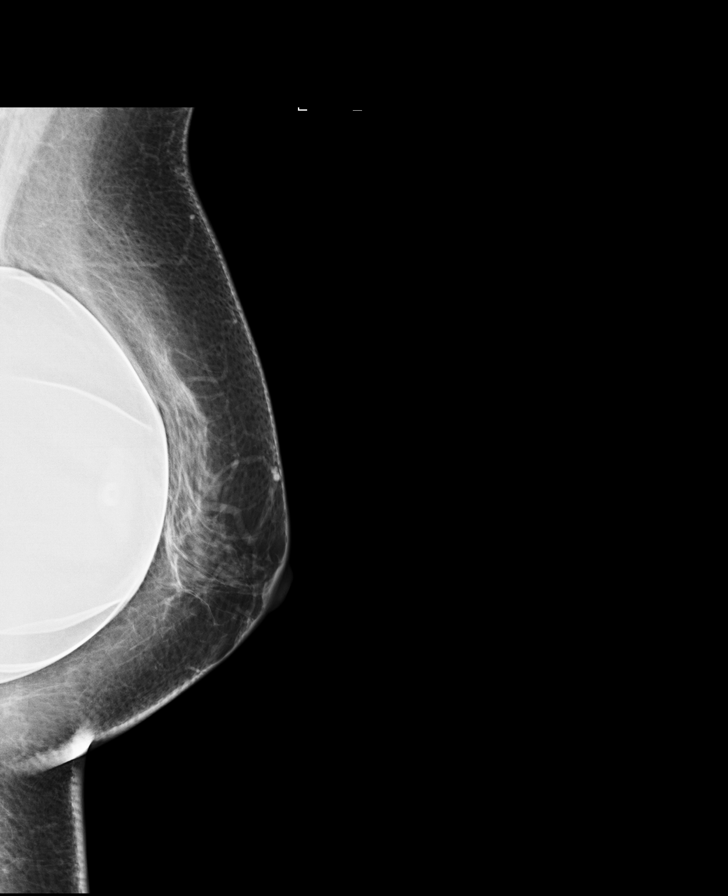

[R MLO]
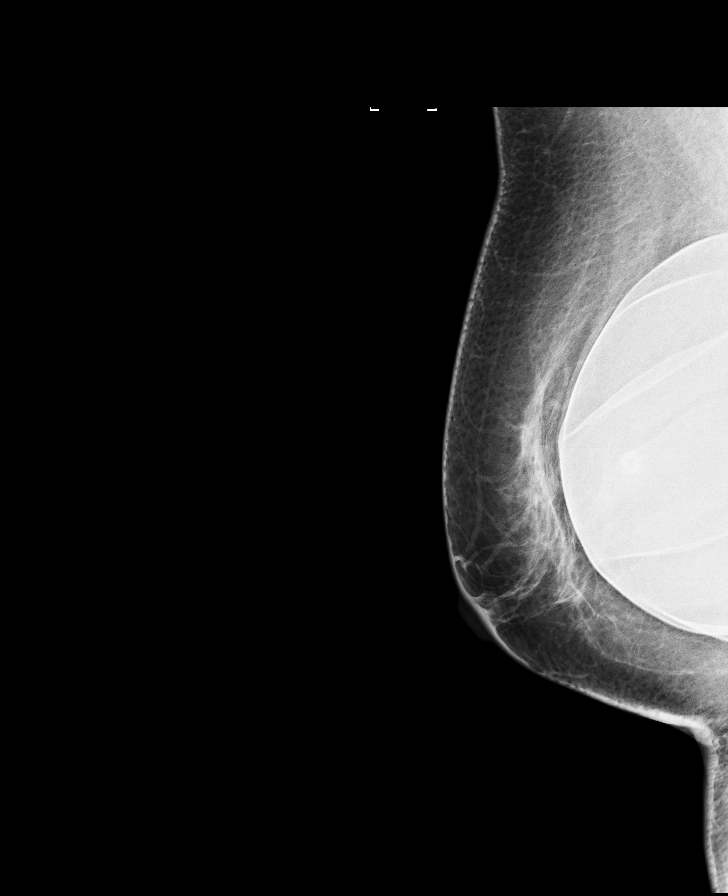

[L CC]
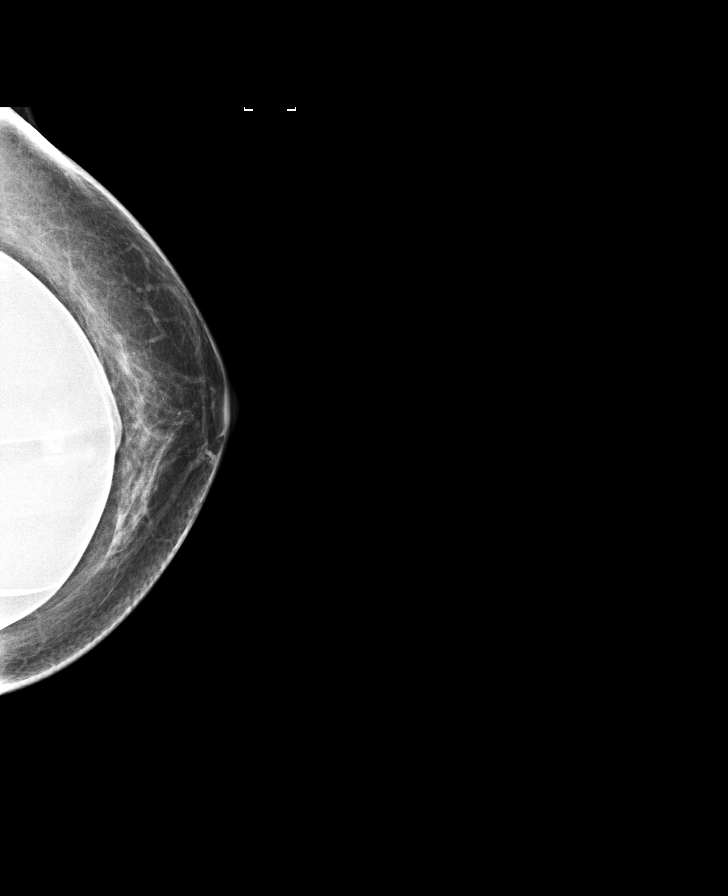

[R CC]
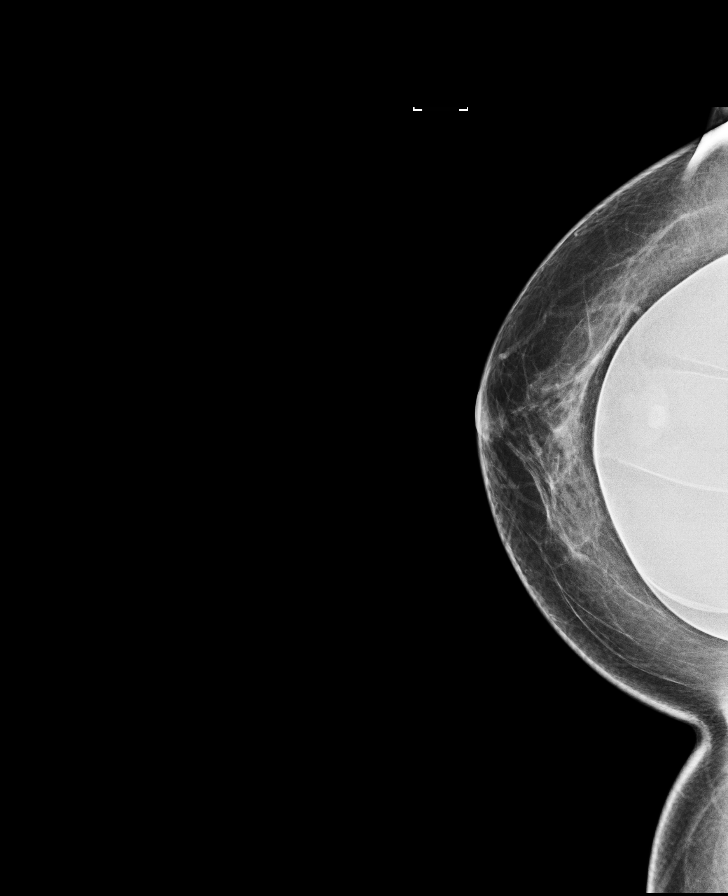

[L MLO synth-2D]
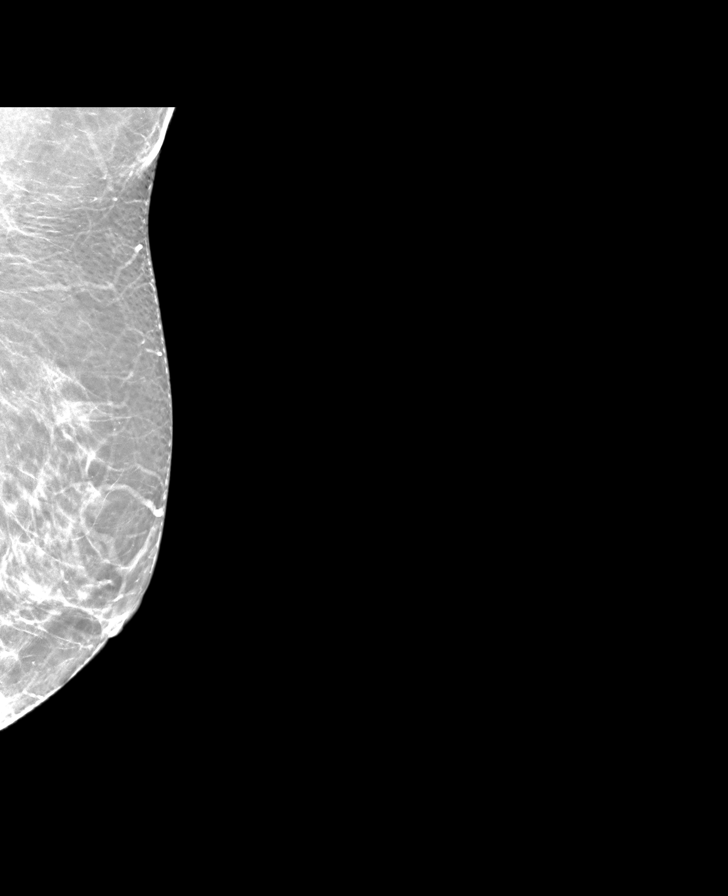

[R MLO synth-2D]
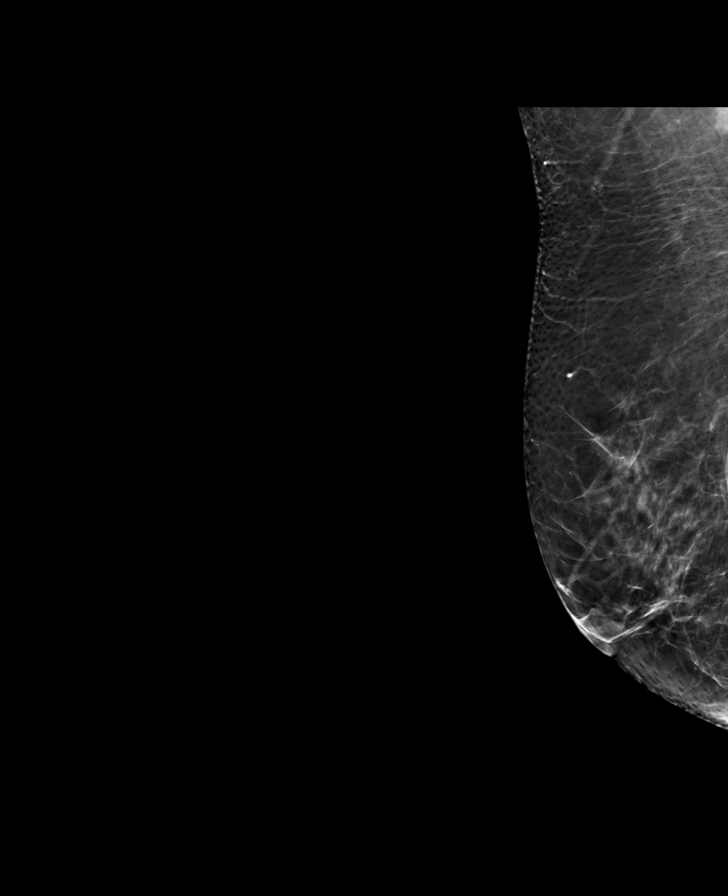

[L CC synth-2D]
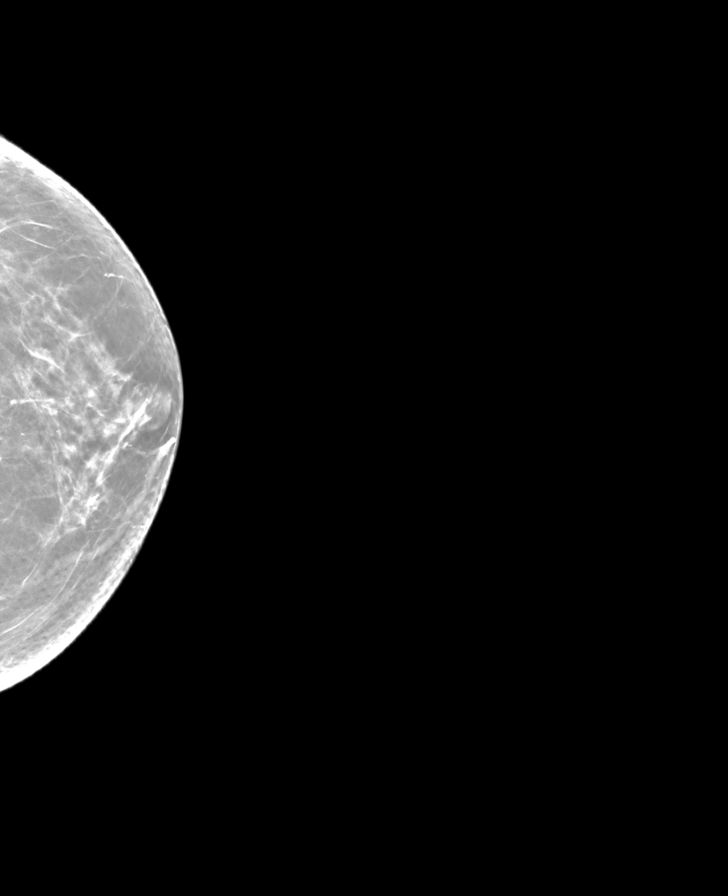

[R CC synth-2D]
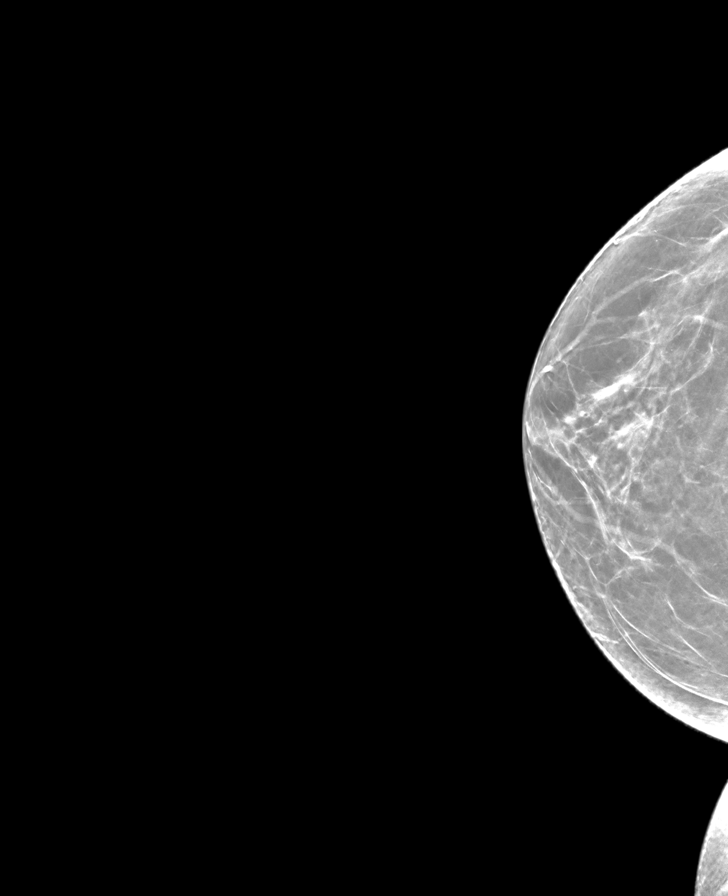

[8 of 28 positions shown; findings below may reference images not displayed]

ACR Breast Density Category c: The breast tissue is heterogeneously
dense, which may obscure small masses.
FINDINGS: In the right axilla, a possible mass warrants further evaluation. In
the left breast, no findings suspicious for malignancy.

The patient has bilateral retropectoral saline implants.
IMPRESSION: Further evaluation is suggested for possible mass in the right
axilla.

RECOMMENDATION:
Ultrasound of the right axilla. (Code:T6-3-UU6)

The patient will be contacted regarding the findings, and additional
imaging will be scheduled.

BI-RADS CATEGORY  0: Incomplete. Need additional imaging evaluation
and/or prior mammograms for comparison.

## 2023-01-08 ENCOUNTER — Other Ambulatory Visit: Payer: Self-pay | Admitting: Family Medicine

## 2023-01-08 DIAGNOSIS — Z87898 Personal history of other specified conditions: Secondary | ICD-10-CM

## 2023-01-08 DIAGNOSIS — R2232 Localized swelling, mass and lump, left upper limb: Secondary | ICD-10-CM

## 2023-01-20 ENCOUNTER — Other Ambulatory Visit: Payer: Self-pay

## 2023-02-05 ENCOUNTER — Ambulatory Visit
Admission: RE | Admit: 2023-02-05 | Discharge: 2023-02-05 | Disposition: A | Discharge status: Home / Self Care | Payer: Self-pay | Source: Ambulatory Visit | Attending: Family Medicine | Admitting: Family Medicine

## 2023-02-05 ENCOUNTER — Other Ambulatory Visit: Payer: Self-pay | Admitting: Family Medicine

## 2023-02-05 ENCOUNTER — Ambulatory Visit
Admission: RE | Admit: 2023-02-05 | Discharge: 2023-02-05 | Disposition: A | Discharge status: Home / Self Care | Payer: Medicaid Other | Source: Ambulatory Visit | Attending: Family Medicine | Admitting: Family Medicine

## 2023-02-05 DIAGNOSIS — R2232 Localized swelling, mass and lump, left upper limb: Secondary | ICD-10-CM

## 2023-02-05 DIAGNOSIS — Z87898 Personal history of other specified conditions: Secondary | ICD-10-CM

## 2024-01-21 ENCOUNTER — Other Ambulatory Visit: Payer: Self-pay | Admitting: Family Medicine

## 2024-01-21 DIAGNOSIS — Z1231 Encounter for screening mammogram for malignant neoplasm of breast: Secondary | ICD-10-CM
